# Patient Record
Sex: Male | Born: 1950 | Race: White | Hispanic: No | Marital: Married | Smoking: Never smoker
Health system: Southern US, Community
[De-identification: ages and names within clinical notes are randomized; demographics above are authoritative.]

## PROBLEM LIST (undated history)

## (undated) DIAGNOSIS — I2510 Atherosclerotic heart disease of native coronary artery without angina pectoris: Secondary | ICD-10-CM

## (undated) DIAGNOSIS — E119 Type 2 diabetes mellitus without complications: Secondary | ICD-10-CM

## (undated) DIAGNOSIS — G459 Transient cerebral ischemic attack, unspecified: Secondary | ICD-10-CM

## (undated) DIAGNOSIS — I639 Cerebral infarction, unspecified: Secondary | ICD-10-CM

## (undated) HISTORY — PX: CORONARY ARTERY BYPASS GRAFT: SHX141

---

## 2016-07-31 ENCOUNTER — Observation Stay (HOSPITAL_COMMUNITY): Payer: Medicare Other

## 2016-07-31 ENCOUNTER — Emergency Department (HOSPITAL_COMMUNITY): Payer: Medicare Other

## 2016-07-31 ENCOUNTER — Encounter (HOSPITAL_COMMUNITY): Payer: Self-pay | Admitting: Adult Health

## 2016-07-31 ENCOUNTER — Observation Stay (HOSPITAL_COMMUNITY)
Admission: EM | Admit: 2016-07-31 | Discharge: 2016-08-01 | Disposition: A | Discharge status: Home / Self Care | Payer: Medicare Other | Attending: Family Medicine | Admitting: Family Medicine

## 2016-07-31 DIAGNOSIS — Z7984 Long term (current) use of oral hypoglycemic drugs: Secondary | ICD-10-CM | POA: Diagnosis not present

## 2016-07-31 DIAGNOSIS — R4781 Slurred speech: Secondary | ICD-10-CM | POA: Diagnosis not present

## 2016-07-31 DIAGNOSIS — Z888 Allergy status to other drugs, medicaments and biological substances status: Secondary | ICD-10-CM | POA: Diagnosis not present

## 2016-07-31 DIAGNOSIS — E119 Type 2 diabetes mellitus without complications: Secondary | ICD-10-CM

## 2016-07-31 DIAGNOSIS — G459 Transient cerebral ischemic attack, unspecified: Principal | ICD-10-CM | POA: Insufficient documentation

## 2016-07-31 DIAGNOSIS — Z951 Presence of aortocoronary bypass graft: Secondary | ICD-10-CM | POA: Insufficient documentation

## 2016-07-31 DIAGNOSIS — Z8673 Personal history of transient ischemic attack (TIA), and cerebral infarction without residual deficits: Secondary | ICD-10-CM | POA: Insufficient documentation

## 2016-07-31 DIAGNOSIS — Z6828 Body mass index (BMI) 28.0-28.9, adult: Secondary | ICD-10-CM | POA: Diagnosis not present

## 2016-07-31 DIAGNOSIS — R200 Anesthesia of skin: Secondary | ICD-10-CM | POA: Diagnosis not present

## 2016-07-31 DIAGNOSIS — Z79899 Other long term (current) drug therapy: Secondary | ICD-10-CM | POA: Diagnosis not present

## 2016-07-31 DIAGNOSIS — Z7901 Long term (current) use of anticoagulants: Secondary | ICD-10-CM | POA: Diagnosis not present

## 2016-07-31 DIAGNOSIS — I10 Essential (primary) hypertension: Secondary | ICD-10-CM | POA: Diagnosis not present

## 2016-07-31 DIAGNOSIS — Z7982 Long term (current) use of aspirin: Secondary | ICD-10-CM | POA: Insufficient documentation

## 2016-07-31 DIAGNOSIS — R202 Paresthesia of skin: Secondary | ICD-10-CM | POA: Diagnosis not present

## 2016-07-31 DIAGNOSIS — I2510 Atherosclerotic heart disease of native coronary artery without angina pectoris: Secondary | ICD-10-CM | POA: Insufficient documentation

## 2016-07-31 DIAGNOSIS — E1151 Type 2 diabetes mellitus with diabetic peripheral angiopathy without gangrene: Secondary | ICD-10-CM | POA: Insufficient documentation

## 2016-07-31 DIAGNOSIS — Z880 Allergy status to penicillin: Secondary | ICD-10-CM | POA: Insufficient documentation

## 2016-07-31 DIAGNOSIS — E669 Obesity, unspecified: Secondary | ICD-10-CM | POA: Diagnosis not present

## 2016-07-31 DIAGNOSIS — Z7902 Long term (current) use of antithrombotics/antiplatelets: Secondary | ICD-10-CM | POA: Insufficient documentation

## 2016-07-31 DIAGNOSIS — I340 Nonrheumatic mitral (valve) insufficiency: Secondary | ICD-10-CM | POA: Diagnosis not present

## 2016-07-31 HISTORY — DX: Transient cerebral ischemic attack, unspecified: G45.9

## 2016-07-31 HISTORY — DX: Type 2 diabetes mellitus without complications: E11.9

## 2016-07-31 HISTORY — DX: Atherosclerotic heart disease of native coronary artery without angina pectoris: I25.10

## 2016-07-31 HISTORY — DX: Cerebral infarction, unspecified: I63.9

## 2016-07-31 LAB — DIFFERENTIAL
BASOS PCT: 0 %
Basophils Absolute: 0 10*3/uL (ref 0.0–0.1)
EOS ABS: 0.1 10*3/uL (ref 0.0–0.7)
Eosinophils Relative: 1 %
Lymphocytes Relative: 31 %
Lymphs Abs: 2.7 10*3/uL (ref 0.7–4.0)
MONO ABS: 1.2 10*3/uL — AB (ref 0.1–1.0)
Monocytes Relative: 14 %
NEUTROS ABS: 4.7 10*3/uL (ref 1.7–7.7)
Neutrophils Relative %: 54 %

## 2016-07-31 LAB — COMPREHENSIVE METABOLIC PANEL
ALT: 31 U/L (ref 17–63)
ANION GAP: 10 (ref 5–15)
AST: 23 U/L (ref 15–41)
Albumin: 4.3 g/dL (ref 3.5–5.0)
Alkaline Phosphatase: 47 U/L (ref 38–126)
BUN: 15 mg/dL (ref 6–20)
CHLORIDE: 102 mmol/L (ref 101–111)
CO2: 24 mmol/L (ref 22–32)
CREATININE: 0.97 mg/dL (ref 0.61–1.24)
Calcium: 9.5 mg/dL (ref 8.9–10.3)
Glucose, Bld: 109 mg/dL — ABNORMAL HIGH (ref 65–99)
POTASSIUM: 3.7 mmol/L (ref 3.5–5.1)
SODIUM: 136 mmol/L (ref 135–145)
Total Bilirubin: 0.9 mg/dL (ref 0.3–1.2)
Total Protein: 6.8 g/dL (ref 6.5–8.1)

## 2016-07-31 LAB — I-STAT CHEM 8, ED
BUN: 18 mg/dL (ref 6–20)
Calcium, Ion: 1.16 mmol/L (ref 1.15–1.40)
Chloride: 101 mmol/L (ref 101–111)
Creatinine, Ser: 1 mg/dL (ref 0.61–1.24)
Glucose, Bld: 106 mg/dL — ABNORMAL HIGH (ref 65–99)
HEMATOCRIT: 42 % (ref 39.0–52.0)
HEMOGLOBIN: 14.3 g/dL (ref 13.0–17.0)
POTASSIUM: 3.6 mmol/L (ref 3.5–5.1)
SODIUM: 139 mmol/L (ref 135–145)
TCO2: 26 mmol/L (ref 0–100)

## 2016-07-31 LAB — PROTIME-INR
INR: 1.05
PROTHROMBIN TIME: 13.7 s (ref 11.4–15.2)

## 2016-07-31 LAB — APTT: APTT: 30 s (ref 24–36)

## 2016-07-31 LAB — I-STAT TROPONIN, ED: TROPONIN I, POC: 0 ng/mL (ref 0.00–0.08)

## 2016-07-31 LAB — CBC
HCT: 41.1 % (ref 39.0–52.0)
Hemoglobin: 14.2 g/dL (ref 13.0–17.0)
MCH: 31.1 pg (ref 26.0–34.0)
MCHC: 34.5 g/dL (ref 30.0–36.0)
MCV: 90.1 fL (ref 78.0–100.0)
PLATELETS: 232 10*3/uL (ref 150–400)
RBC: 4.56 MIL/uL (ref 4.22–5.81)
RDW: 12.8 % (ref 11.5–15.5)
WBC: 8.7 10*3/uL (ref 4.0–10.5)

## 2016-07-31 LAB — GLUCOSE, CAPILLARY: GLUCOSE-CAPILLARY: 146 mg/dL — AB (ref 65–99)

## 2016-07-31 LAB — CBG MONITORING, ED: GLUCOSE-CAPILLARY: 147 mg/dL — AB (ref 65–99)

## 2016-07-31 MED ORDER — ACETAMINOPHEN 325 MG PO TABS: 650.0000 mg | ORAL_TABLET | ORAL | Status: DC | PRN

## 2016-07-31 MED ORDER — LABETALOL HCL 5 MG/ML IV SOLN: 10.0000 mg | INTRAVENOUS | Status: DC | PRN

## 2016-07-31 MED ORDER — ACETAMINOPHEN 650 MG RE SUPP: 650.0000 mg | RECTAL | Status: DC | PRN

## 2016-07-31 MED ORDER — LOSARTAN POTASSIUM 50 MG PO TABS
100.0000 mg | ORAL_TABLET | Freq: Every day | ORAL | Status: DC
  Administered 2016-08-01: 100 mg via ORAL
  Filled 2016-07-31: qty 2

## 2016-07-31 MED ORDER — ASPIRIN EC 325 MG PO TBEC
325.0000 mg | DELAYED_RELEASE_TABLET | Freq: Once | ORAL | Status: AC
  Administered 2016-08-01: 325 mg via ORAL
  Filled 2016-07-31: qty 1

## 2016-07-31 MED ORDER — METOPROLOL TARTRATE 25 MG PO TABS
25.0000 mg | ORAL_TABLET | Freq: Two times a day (BID) | ORAL | Status: DC
  Administered 2016-07-31 – 2016-08-01 (×2): 25 mg via ORAL
  Filled 2016-07-31 (×2): qty 1

## 2016-07-31 MED ORDER — STROKE: EARLY STAGES OF RECOVERY BOOK
Freq: Once | Status: AC
  Administered 2016-07-31: 23:00:00

## 2016-07-31 MED ORDER — ATORVASTATIN CALCIUM 40 MG PO TABS: 40.0000 mg | ORAL_TABLET | Freq: Every day | ORAL | Status: DC

## 2016-07-31 MED ORDER — ENOXAPARIN SODIUM 40 MG/0.4ML ~~LOC~~ SOLN
40.0000 mg | SUBCUTANEOUS | Status: DC
  Administered 2016-07-31: 40 mg via SUBCUTANEOUS
  Filled 2016-07-31: qty 0.4

## 2016-07-31 MED ORDER — SENNOSIDES-DOCUSATE SODIUM 8.6-50 MG PO TABS: 1.0000 | ORAL_TABLET | Freq: Every evening | ORAL | Status: DC | PRN

## 2016-07-31 MED ORDER — ATORVASTATIN CALCIUM 40 MG PO TABS
40.0000 mg | ORAL_TABLET | Freq: Every day | ORAL | Status: DC
  Administered 2016-07-31: 40 mg via ORAL
  Filled 2016-07-31: qty 1

## 2016-07-31 MED ORDER — ACETAMINOPHEN 160 MG/5ML PO SOLN: 650.0000 mg | ORAL | Status: DC | PRN

## 2016-07-31 NOTE — H&P (Signed)
History and Physical   Gregory Roman WUJ:[id number] DOB: Jun 11, 1950 DOA: 07/31/2016  Referring MD/NP/PA: Rolan Bucco, MD, EDP PCP: Silva Bandy, MD Outpatient Specialists: [facility], Neurologist in [place], [place]. Patient coming from: Home  Chief Complaint: finger numbness, slurred speech, right facial droop  HPI: Gregory Roman is a 66 y.o. male with a history of 5 previous TIAs, HTN, CAD s/p CABG, and NIDDM brought by EMS for neurological deficits. He and his wife describe sudden onset at ~1:30pm today of numbness in the fingertips of the 3rd-5th fingers on the right hand radiating to right elbow associated with "garbled speech," faint right facial droop per wife, and word blocking (couldn't recall the word "cookie") which lasted about 20 minutes and subsided after taking a 325mg  aspirin. He also endorses right (his dominant) hand weakness. Symptoms resolved en route to ED.  ED Course: Vital signs were notable for hypertension, with nonfocal neurological exam and negative head CT. Neurology evaluated the patient and initially recommended discharge, but later recommended observation for expedited work up. Hospitalists consulted.  Review of Systems: No fever, chills, headache, and per HPI. All others reviewed and are negative.   Past Medical History:  Diagnosis Date  . Coronary artery disease   . Diabetes mellitus without complication (HCC)   . Stroke (HCC)   . TIA (transient ischemic attack)    Past Surgical History:  Procedure Laterality Date  . CORONARY ARTERY BYPASS GRAFT     - Never smoker, drinks 1 - 2 glasses of red wine with dinner nightly. No other drugs.   Allergies  Allergen Reactions  . Penicillins     Child hood allergy    History reviewed. No pertinent family history. - Family history otherwise reviewed and not pertinent. No premature CAD.   Physical Exam: Vitals:   07/31/16 1557 07/31/16 1558 07/31/16 1715 07/31/16 1737  BP: (!) 181/94  (!) 180/90   Pulse: 79  77    Resp: 18  (!) 27   Temp: 99.7 F (37.6 C)   99.6 F (37.6 C)  TempSrc: Oral     SpO2: 97%  96%   Weight:  91.4 kg (201 lb 8 oz)     Constitutional: 66 y.o. male in no distress, calm demeanor Eyes: Lids and conjunctivae normal, PERRL ENMT: Mucous membranes are moist. Posterior pharynx clear of any exudate or lesions. Fair dentition.  Neck: normal, supple, no masses, no thyromegaly Respiratory: Non-labored breathing without accessory muscle use. Clear breath sounds to auscultation bilaterally Cardiovascular: Regular rate and rhythm, no murmurs, rubs, or gallops. No carotid bruits. No JVD. No LE edema. 2+ pedal pulses. Abdomen: Normoactive bowel sounds. No tenderness, non-distended, and no masses palpated. No hepatosplenomegaly. GU: No indwelling catheter Musculoskeletal: No clubbing / cyanosis. No joint deformity upper and lower extremities. Good ROM, no contractures. Normal muscle tone.  Skin: Warm, dry. No rashes, wounds, no ulcers. No significant lesions noted.  Neurologic: CN II-XII grossly intact. Gait narrow-based. Speech normal. No focal deficits in motor strength or sensation in all extremities.  Psychiatric: Alert and oriented x3. Normal judgment and insight. Mood euthymic with congruent affect.   CBC:  Recent Labs Lab 07/31/16 1500 07/31/16 1532  WBC 8.7  --   NEUTROABS 4.7  --   HGB 14.2 14.3  HCT 41.1 42.0  MCV 90.1  --   PLT 232  --    Basic Metabolic Panel:  Recent Labs Lab 07/31/16 1500 07/31/16 1532  NA 136 139  K 3.7 3.6  CL 102  101  CO2 24  --   GLUCOSE 109* 106*  BUN 15 18  CREATININE 0.97 1.00  CALCIUM 9.5  --    Liver Function Tests:  Recent Labs Lab 07/31/16 1500  AST 23  ALT 31  ALKPHOS 47  BILITOT 0.9  PROT 6.8  ALBUMIN 4.3   Radiological Exams on Admission: Ct Head Code Stroke W/o Cm  Result Date: 07/31/2016 CLINICAL DATA:  Code stroke. No history provided or note in the medical record. EXAM: CT HEAD WITHOUT CONTRAST TECHNIQUE:  Contiguous axial images were obtained from the base of the skull through the vertex without intravenous contrast. COMPARISON:  None. FINDINGS: Brain: No evidence of acute infarction, hemorrhage, hydrocephalus, extra-axial collection or mass lesion/mass effect. There is patchy bilateral cerebral white matter low density compatible with chronic small vessel ischemia. Remote appearing left thalamus lacunar infarct and left putamen/ deep white matter perforator infarct. Vascular: Atherosclerotic calcification. No asymmetric vessel hyperdensity. Skull: No acute finding Sinuses/Orbits: No acute finding Other: Text page with results sent 07/31/2016 at 3:36 pm to Dr. Sharee HolsterEhsraghi. ASPECTS [facility]([place] Stroke Program Early CT Score) Deficit was not reported.  Aspects is 10 bilaterally. IMPRESSION: 1. No acute finding. ASPECTS is 10 bilaterally. 2. Small vessel ischemic injury. Electronically Signed   By: Marnee SpringJonathon  Watts M.D.   On: 07/31/2016 15:39   EKG: Independently reviewed. NSR with normal intervals and axis. No ST segment changes. No priors available.  Assessment/Plan Active Problems:   Transient ischemic attack (TIA)  Suspected recurrent TIA: RF's include history of TIA (received tPA in the remote past), CAD (h/o CABG), DM, HTN. No tPA given due to symptoms resolved. Seen by neurology in ED initially thought to be candidate for discharge, but high risk history favors observation and expedited work up.  - Neurohospitalist to follow patient 5/12. - MRI of brain w/o contrast ordered - 2D echocardiogram  - Carotid dopplers  - Neuro checks per protocol - PT/OT/SLP: Early mobilization - Anti-platelet per neurology (took aspirin today, has rash reaction to plavix) - High intensity statin - Permissive HTN: labetalol IV prn SBP > 220 or DBP > 120 - Hb A1c, lipids  CAD: s/p hx CABG. Trop negative, no anginal complaints.  - Continue beta blocker, ARB, anti-platelet  T2DM:  - Hold metformin and [facility] - SSI - Check  HbA1c as above  HTN:  - Allowing permissive HTN as above - Continue home medications and monitor  DVT prophylaxis: Lovenox  Code Status: Full  Family Communication: Wife at bedside Disposition Plan: Observe overnight, complete TIA work up. Anticipate DC to home 5/12.  Consults called: Neurology by EDP  Admission status: Observation    Hazeline Junkeryan Noreene Boreman, MD [facility] Pager [phone]  If 7PM-7AM, please contact night-coverage [email or web address] Password [email or web address] 07/31/2016, 5:39 PM

## 2016-07-31 NOTE — ED Triage Notes (Signed)
Presents with onset of right sided facial droop, right hand tingling to elbow and difficulty getting words out. LKW was 1455. Symptoms have resolved. Neurology cancelled code stroke in CT

## 2016-07-31 NOTE — Consult Note (Cosign Needed)
Reason for Consult: Code Stroke Referring Physician: ER  Gregory Roman is an 66 y.o. male.  HPI: Acute onset of word finding difficulty and right hand numbness.  He denies facial involvement to me.  LSN 14:55.  He admits to having numbness and tingling in both hands involving digits 2-4 for past year and this was the same just stronger.  He has had an EMG/NCS as outpatient but does not know results clearly.  His BP is high and was compliant with BP meds  and he had been taking ASA 325 daily.  He is non-smoker.  His glucose was 102.  He is on statin. CT Brain was normal.   Past Medical History:  Diagnosis Date  . Coronary artery disease   . Diabetes mellitus without complication (HCC)   . Stroke (HCC)   . TIA (transient ischemic attack)     Past Surgical History:  Procedure Laterality Date  . CORONARY ARTERY BYPASS GRAFT      History reviewed. No pertinent family history.  Social History:  reports that he has never smoked. He has never used smokeless tobacco. He reports that he does not drink alcohol or use drugs.  Allergies: Allergies not on file  Prior to Admission medications   Not on File    Medications: Prior to Admission:  (Not in a hospital admission) Scheduled:  Results for orders placed or performed during the hospital encounter of 07/31/16 (from the past 48 hour(s))  Protime-INR     Status: None   Collection Time: 07/31/16  3:00 PM  Result Value Ref Range   Prothrombin Time 13.7 11.4 - 15.2 seconds   INR 1.05   APTT     Status: None   Collection Time: 07/31/16  3:00 PM  Result Value Ref Range   aPTT 30 24 - 36 seconds  CBC     Status: None   Collection Time: 07/31/16  3:00 PM  Result Value Ref Range   WBC 8.7 4.0 - 10.5 K/uL   RBC 4.56 4.22 - 5.81 MIL/uL   Hemoglobin 14.2 13.0 - 17.0 g/dL   HCT 82.9 56.2 - 13.0 %   MCV 90.1 78.0 - 100.0 fL   MCH 31.1 26.0 - 34.0 pg   MCHC 34.5 30.0 - 36.0 g/dL   RDW 86.5 78.4 - 69.6 %   Platelets 232 150 - 400 K/uL   Differential     Status: Abnormal   Collection Time: 07/31/16  3:00 PM  Result Value Ref Range   Neutrophils Relative % 54 %   Neutro Abs 4.7 1.7 - 7.7 K/uL   Lymphocytes Relative 31 %   Lymphs Abs 2.7 0.7 - 4.0 K/uL   Monocytes Relative 14 %   Monocytes Absolute 1.2 (H) 0.1 - 1.0 K/uL   Eosinophils Relative 1 %   Eosinophils Absolute 0.1 0.0 - 0.7 K/uL   Basophils Relative 0 %   Basophils Absolute 0.0 0.0 - 0.1 K/uL  I-stat troponin, ED     Status: None   Collection Time: 07/31/16  3:30 PM  Result Value Ref Range   Troponin i, poc 0.00 0.00 - 0.08 ng/mL   Comment 3            Comment: Due to the release kinetics of cTnI, a negative result within the first hours of the onset of symptoms does not rule out myocardial infarction with certainty. If myocardial infarction is still suspected, repeat the test at appropriate intervals.   I-Stat Chem 8, ED  Status: Abnormal   Collection Time: 07/31/16  3:32 PM  Result Value Ref Range   Sodium 139 135 - 145 mmol/L   Potassium 3.6 3.5 - 5.1 mmol/L   Chloride 101 101 - 111 mmol/L   BUN 18 6 - 20 mg/dL   Creatinine, Ser 1.611.00 0.61 - 1.24 mg/dL   Glucose, Bld 096106 (H) 65 - 99 mg/dL   Calcium, Ion 0.451.16 4.091.15 - 1.40 mmol/L   TCO2 26 0 - 100 mmol/L   Hemoglobin 14.3 13.0 - 17.0 g/dL   HCT 81.142.0 91.439.0 - 78.252.0 %    Ct Head Code Stroke W/o Cm  Result Date: 07/31/2016 CLINICAL DATA:  Code stroke. No history provided or note in the medical record. EXAM: CT HEAD WITHOUT CONTRAST TECHNIQUE: Contiguous axial images were obtained from the base of the skull through the vertex without intravenous contrast. COMPARISON:  None. FINDINGS: Brain: No evidence of acute infarction, hemorrhage, hydrocephalus, extra-axial collection or mass lesion/mass effect. There is patchy bilateral cerebral white matter low density compatible with chronic small vessel ischemia. Remote appearing left thalamus lacunar infarct and left putamen/ deep white matter perforator  infarct. Vascular: Atherosclerotic calcification. No asymmetric vessel hyperdensity. Skull: No acute finding Sinuses/Orbits: No acute finding Other: Text page with results sent 07/31/2016 at 3:36 pm to Dr. Sharee HolsterEhsraghi. ASPECTS [facility]([place] Stroke Program Early CT Score) Deficit was not reported.  Aspects is 10 bilaterally. IMPRESSION: 1. No acute finding. ASPECTS is 10 bilaterally. 2. Small vessel ischemic injury. Electronically Signed   By: Marnee SpringJonathon  Watts M.D.   On: 07/31/2016 15:39    ROS Blood pressure (!) 181/94, pulse 79, temperature 99.7 F (37.6 C), temperature source Oral, resp. rate 18, weight 91.4 kg (201 lb 8 oz), SpO2 97 %. Neurologic Examination:  Awake, alert, fully oriented.  Language-fluent.  Comprehension, naming, repetition- intact. Face symmetrical. Tongue midline. EOMI.  PERL. Strength 5/5 BUE and BLE. Coord - FTN, HTS intact bilaterally.  Assessment/Plan:  Possible TIA due to uncontrolled BP.  It may also be peripheral nerve related and naming issues was not related.    He was not given IV tPA due to resolution of all symptoms.    Recommended he see PCP asap for better BP control.  He was asked to switch from ASA to Plavix 75 mg qd.  His PCP can check FLP and adjust statin as needed.  His DM seems to be well controlled.    From my view point, he can be discharged to f/u with PCP asap.  He can have CDUS and TTE as outpatient.      Weston SettleESHRAGHI, Mahsa Hanser, MD 07/31/2016, 4:04 PM

## 2016-07-31 NOTE — ED Notes (Signed)
Patient taken to MRI

## 2016-07-31 NOTE — ED Notes (Signed)
[facility] contacted to cancel Code Stroke 

## 2016-07-31 NOTE — ED Provider Notes (Signed)
[facility]-EMERGENCY DEPT Provider Note   CSN: [id number] Arrival date & time: 07/31/16  1525     History   Chief Complaint Chief Complaint  Patient presents with  . Code Stroke  . Seizures    HPI Gregory Roman is a 66 y.o. male.  Patient is a 66 year old male with a history of diabetes, coronary artery disease, TIA/CVA who presents with dys artery and right arm numbness/weakness. His last known normal was 1:30 PM today. Per the patient and his wife he had a sudden onset of numbness to his right hand and arm and he dropped something that he was holding. He also is having trouble getting his words out and had some right-sided facial drooping. There is no leg involvement. No balance issues. Code stroke was activated by EMS. This was subsequently canceled on neurology evaluation. Patient states that his symptoms resolved in route to the hospital and he currently denies any strokelike symptoms. He denies any chest pain or shortness of breath. No other recent illnesses. He's had 4-5 prior TIAs with similar symptoms. He states he also got TPA in the past for a stroke which he thinks was about a year ago. He is followed by a neurologist in [facility].      Past Medical History:  Diagnosis Date  . Coronary artery disease   . Diabetes mellitus without complication (HCC)   . Stroke (HCC)   . TIA (transient ischemic attack)     Patient Active Problem List   Diagnosis Date Noted  . Transient ischemic attack (TIA) 07/31/2016    Past Surgical History:  Procedure Laterality Date  . CORONARY ARTERY BYPASS GRAFT         Home Medications    Prior to Admission medications   Not on File    Family History History reviewed. No pertinent family history.  Social History Social History  Substance Use Topics  . Smoking status: Never Smoker  . Smokeless tobacco: Never Used  . Alcohol use No     Allergies   Patient has no allergy information on record.   Review of Systems Review of  Systems  Constitutional: Negative for chills, diaphoresis, fatigue and fever.  HENT: Negative for congestion, rhinorrhea and sneezing.   Eyes: Negative.   Respiratory: Negative for cough, chest tightness and shortness of breath.   Cardiovascular: Negative for chest pain and leg swelling.  Gastrointestinal: Negative for abdominal pain, blood in stool, diarrhea, nausea and vomiting.  Genitourinary: Negative for difficulty urinating, flank pain, frequency and hematuria.  Musculoskeletal: Negative for arthralgias and back pain.  Skin: Negative for rash.  Neurological: Positive for speech difficulty, weakness and numbness. Negative for dizziness and headaches.     Physical Exam Updated Vital Signs BP (!) 181/94 (BP Location: Right Arm)   Pulse 79   Temp 99.7 F (37.6 C) (Oral)   Resp 18   Wt 201 lb 8 oz (91.4 kg)   SpO2 97%   Physical Exam  Constitutional: He is oriented to person, place, and time. He appears well-developed and well-nourished.  HENT:  Head: Normocephalic and atraumatic.  Eyes: Pupils are equal, round, and reactive to light.  Neck: Normal range of motion. Neck supple.  Cardiovascular: Normal rate, regular rhythm and normal heart sounds.   Pulmonary/Chest: Effort normal and breath sounds normal. No respiratory distress. He has no wheezes. He has no rales. He exhibits no tenderness.  Abdominal: Soft. Bowel sounds are normal. There is no tenderness. There is no rebound and no guarding.  Musculoskeletal: Normal range of motion. He exhibits no edema.  Lymphadenopathy:    He has no cervical adenopathy.  Neurological: He is alert and oriented to person, place, and time.  Motor 5/5 all extremities Sensation grossly intact to LT all extremities Finger to Nose intact, no pronator drift CN II-XII grossly intact    Skin: Skin is warm and dry. No rash noted.  Psychiatric: He has a normal mood and affect.     ED Treatments / Results  Labs (all labs ordered are listed,  but only abnormal results are displayed) Labs Reviewed  DIFFERENTIAL - Abnormal; Notable for the following:       Result Value   Monocytes Absolute 1.2 (*)    All other components within normal limits  COMPREHENSIVE METABOLIC PANEL - Abnormal; Notable for the following:    Glucose, Bld 109 (*)    All other components within normal limits  I-STAT CHEM 8, ED - Abnormal; Notable for the following:    Glucose, Bld 106 (*)    All other components within normal limits  PROTIME-INR  APTT  CBC  I-STAT TROPOININ, ED  CBG MONITORING, ED    EKG  EKG Interpretation  Date/Time:  Friday Jul 31 2016 16:04:05 EDT Ventricular Rate:  75 PR Interval:    QRS Duration: 101 QT Interval:  403 QTC Calculation: 451 R Axis:   64 Text Interpretation:  Sinus rhythm RSR' in V1 or V2, probably normal variant No old tracing to compare Confirmed by Demetry Bendickson  MD, Doloros Kwolek ([id number]) on 07/31/2016 4:21:32 PM       Radiology Ct Head Code Stroke W/o Cm  Result Date: 07/31/2016 CLINICAL DATA:  Code stroke. No history provided or note in the medical record. EXAM: CT HEAD WITHOUT CONTRAST TECHNIQUE: Contiguous axial images were obtained from the base of the skull through the vertex without intravenous contrast. COMPARISON:  None. FINDINGS: Brain: No evidence of acute infarction, hemorrhage, hydrocephalus, extra-axial collection or mass lesion/mass effect. There is patchy bilateral cerebral white matter low density compatible with chronic small vessel ischemia. Remote appearing left thalamus lacunar infarct and left putamen/ deep white matter perforator infarct. Vascular: Atherosclerotic calcification. No asymmetric vessel hyperdensity. Skull: No acute finding Sinuses/Orbits: No acute finding Other: Text page with results sent 07/31/2016 at 3:36 pm to Dr. Sharee Holster. ASPECTS [facility] Stroke Program Early CT Score) Deficit was not reported.  Aspects is 10 bilaterally. IMPRESSION: 1. No acute finding. ASPECTS is 10 bilaterally. 2.  Small vessel ischemic injury. Electronically Signed   By: Marnee Spring M.D.   On: 07/31/2016 15:39    Procedures Procedures (including critical care time)  Medications Ordered in ED Medications - No data to display   Initial Impression / Assessment and Plan / ED Course  I have reviewed the triage vital signs and the nursing notes.  Pertinent labs & imaging results that were available during my care of the patient were reviewed by me and considered in my medical decision making (see chart for details).     Patient presents with right-sided facial drooping associated with word finding difficulty and right-sided numbness and weakness. Patient CT is negative. He has no current symptoms. His symptoms are possibly consistent with a TIA. He's had a history of TIAs in the past. His past admissions were in [place]. I spoke with the neuro hospitalist who will follow the patient and we will admit the patient to the hospitalist service. I spoke with Dr.Grunz who has agreed to admit the patient.  Final Clinical  Impressions(s) / ED Diagnoses   Final diagnoses:  Transient cerebral ischemia, unspecified type    New Prescriptions New Prescriptions   No medications on file     Rolan BuccoBelfi, Cintia Gleed, MD 07/31/16 1704

## 2016-08-01 ENCOUNTER — Observation Stay (HOSPITAL_COMMUNITY): Payer: Medicare Other

## 2016-08-01 ENCOUNTER — Other Ambulatory Visit (HOSPITAL_COMMUNITY): Payer: Medicare Other

## 2016-08-01 ENCOUNTER — Observation Stay (HOSPITAL_BASED_OUTPATIENT_CLINIC_OR_DEPARTMENT_OTHER): Payer: Medicare Other

## 2016-08-01 DIAGNOSIS — G458 Other transient cerebral ischemic attacks and related syndromes: Secondary | ICD-10-CM | POA: Diagnosis not present

## 2016-08-01 DIAGNOSIS — E119 Type 2 diabetes mellitus without complications: Secondary | ICD-10-CM | POA: Diagnosis not present

## 2016-08-01 DIAGNOSIS — G459 Transient cerebral ischemic attack, unspecified: Secondary | ICD-10-CM

## 2016-08-01 DIAGNOSIS — I10 Essential (primary) hypertension: Secondary | ICD-10-CM | POA: Diagnosis not present

## 2016-08-01 LAB — LIPID PANEL
CHOLESTEROL: 88 mg/dL (ref 0–200)
HDL: 33 mg/dL — AB (ref >40)
LDL CALC: 32 mg/dL (ref 0–99)
TRIGLYCERIDES: 114 mg/dL (ref <150)
Total CHOL/HDL Ratio: 2.7 RATIO
VLDL: 23 mg/dL (ref 0–40)

## 2016-08-01 LAB — ECHOCARDIOGRAM COMPLETE
CHL CUP DOP CALC LVOT VTI: 20 cm
E decel time: 236 msec
EERAT: 8.78
FS: 31 % (ref 28–44)
HEIGHTINCHES: 69 in
IV/PV OW: 1.08
LA vol A4C: 65 ml
LA vol index: 23.9 mL/m2
LA vol: 49.4 mL
LADIAMINDEX: 1.74 cm/m2
LASIZE: 36 mm
LEFT ATRIUM END SYS DIAM: 36 mm
LV dias vol index: 39 mL/m2
LVDIAVOL: 80 mL (ref 62–150)
LVEEAVG: 8.78
LVEEMED: 8.78
LVELAT: 9.57 cm/s
LVOT area: 3.14 cm2
LVOT diameter: 20 mm
LVOT peak grad rest: 3 mmHg
LVOTPV: 93.5 cm/s
LVOTSV: 63 mL
LVSYSVOL: 29 mL
LVSYSVOLIN: 14 mL/m2
MV Dec: 236
MV pk A vel: 84.3 m/s
MVAP: 3.19 cm2
MVPG: 3 mmHg
MVPKEVEL: 84 m/s
P 1/2 time: 69 ms
PW: 13 mm — AB (ref 0.6–1.1)
RV LATERAL S' VELOCITY: 9.57 cm/s
RV TAPSE: 15.2 mm
Simpson's disk: 63
Stroke v: 51 ml
TDI e' lateral: 9.57
TDI e' medial: 5.66
Weight: 3051.2 oz

## 2016-08-01 LAB — GLUCOSE, CAPILLARY: Glucose-Capillary: 119 mg/dL — ABNORMAL HIGH (ref 65–99)

## 2016-08-01 NOTE — Progress Notes (Signed)
Pt d/c to home by car with family. Assessment stable. All questions answered. 

## 2016-08-01 NOTE — Care Management Note (Signed)
Case Management Note  Patient Details  Name: Aleatha BorerJames Regner MRN: [id number] Date of Birth: [birth year]  Subjective/Objective:                 Patient in observation. No CM consults, orders, or needs identified at this time. Order to DC to home.    Action/Plan:  Dc to home.  Expected Discharge Date:  08/01/16               Expected Discharge Plan:  Home/Self Care  In-House Referral:     Discharge planning Services  CM Consult  Post Acute Care Choice:    Choice offered to:     DME Arranged:    DME Agency:     HH Arranged:    HH Agency:     Status of Service:  Completed, signed off  If discussed at [employer] Length of Stay Meetings, dates discussed:    Additional Comments:  Lawerance SabalDebbie Adena Sima, RN 08/01/2016, 4:07 PM

## 2016-08-01 NOTE — Discharge Summary (Signed)
Physician Discharge Summary  Gregory Roman ZOX:[id number] DOB: 1951-02-05 DOA: 07/31/2016  PCP: Gregory Bandy, MD  Admit date: 07/31/2016 Discharge date: 08/01/2016  Admitted From: Home Disposition: Home   Recommendations for Outpatient Follow-up:  1. Follow up with PCP in next week for HTN management. 2. HbA1c 6.7%.   Home Health: None Equipment/Devices: None Discharge Condition: Stable CODE STATUS: Full Diet recommendation: Heart healthy, carbohydrate-limited  Brief/Interim Summary: Gregory Roman is a 66 y.o. male with a history of 5 previous TIAs, HTN, CAD s/p CABG, and NIDDM brought by EMS for neurological deficits. He and his wife describe sudden onset at ~1:30pm today of numbness in the fingertips of the 3rd-5th fingers on the right hand radiating to right elbow associated with "garbled speech," faint right facial droop per wife, and word blocking (couldn't recall the word "cookie") which lasted about 20 minutes and subsided after taking a 325mg  aspirin. He also endorses right (his dominant) hand weakness. Symptoms resolved en route to ED.  Vital signs were notable for hypertension, with nonfocal neurological exam and negative head CT. Neurology evaluated the patient and initially recommended discharge, but later recommended observation for expedited work up. Hospitalists consulted. MRI was negative. Echocardiogram showed no cardioembolic source, and carotid U/S showed widely patent arteries. He is continued on aspirin per neurology recommendations as he has not tolerated plavix or aggrenox in the past.   Discharge Diagnoses:  Principal Problem:   Transient ischemic attack (TIA) Active Problems:   Coronary artery disease   Diabetes mellitus without complication (HCC)   Benign essential HTN  Recurrent TIA: CT head and MRI brain negative for acute stroke, with bilateral white matter and basal ganglia lacunar infarcts and findings of chronic microvascular ischemia. RF's include  history of TIA (received tPA in the remote past), CAD (h/o CABG), DM, HTN. No tPA given due to symptoms resolved.  - Neurology recommendations as below: - 2D echocardiogram: No cardioembolic source - Carotid dopplers without high-grade stenosis - Therapy evaluations: no follow up required - Anti-platelet per neurology: Continue aspirin 325mg  daily (allergy to plavix and intolerance of aggrenox in the past) - Continue lipitor 40mg , LDL at goal.  - Permissive HTN during hospitalization, but will need PCP follow up for stricter control.   CAD: s/p hx CABG. Trop negative, no anginal complaints.  - Continue beta blocker, ARB, anti-platelet  T2DM: HbA1c 6.7%  HTN:  - Allowing permissive HTN as above - Continue home medications and monitor  Discharge Instructions Discharge Instructions    Discharge instructions    Complete by:  As directed    You were admitted for stroke-like symptoms which have resolved. The work up to date has remained negative and neurology has evaluated you. You are stable for discharge with the following recommendations:  - Continue taking all medications as prescribed including aspirin 325mg  daily.  - Follow up with your doctor within the next week for hospital follow up appointment and blood pressure evaluation. You may require increased intensity of hypertension management.     Allergies as of 08/01/2016      Reactions   Actos [pioglitazone] Swelling   Flomax [tamsulosin]    Flush    Lisinopril Swelling   Penicillins    Child hood allergy    Ciprofloxacin Rash   Plavix [clopidogrel] Rash      Medication List    TAKE these medications   aspirin 325 MG tablet Take 325 mg by mouth daily.   atorvastatin 40 MG tablet Commonly known as:  LIPITOR Take  40 mg by mouth daily.   glimepiride 2 MG tablet Commonly known as:  AMARYL Take 2 mg by mouth daily with breakfast.   losartan 100 MG tablet Commonly known as:  COZAAR Take 100 mg by mouth daily.    metFORMIN 500 MG tablet Commonly known as:  GLUCOPHAGE Take 1,000 mg by mouth 2 (two) times daily with a meal.   metoprolol tartrate 25 MG tablet Commonly known as:  LOPRESSOR Take 25 mg by mouth 2 (two) times daily.   multivitamin with minerals tablet Take 1 tablet by mouth daily.      Follow-up Information    Gregory BandyKopynec, Bohdan, MD Follow up.   Specialty:  Family Medicine Contact information: [address] [place] [id number] [phone]          Allergies  Allergen Reactions  . Actos [Pioglitazone] Swelling  . Flomax [Tamsulosin]     Flush   . Lisinopril Swelling  . Penicillins     Child hood allergy   . Ciprofloxacin Rash  . Plavix [Clopidogrel] Rash    Consultations:  Neurology  Procedures/Studies: Mr Brain Wo Contrast  Result Date: 07/31/2016 CLINICAL DATA:  Right-sided numbness and weakness. EXAM: MRI HEAD WITHOUT CONTRAST TECHNIQUE: Multiplanar, multiecho pulse sequences of the brain and surrounding structures were obtained without intravenous contrast. COMPARISON:  Head CT same day FINDINGS: Brain: The midline structures are normal. No focal diffusion restriction to indicate acute infarct. No intraparenchymal hemorrhage. Bold bilateral lacunar infarcts of the basal ganglia and corona radiata. There is multifocal hyperintense T2-weighted signal within the periventricular white matter, most often seen in the setting of chronic microvascular ischemia. No mass lesion. No chronic microhemorrhage or cerebral amyloid angiopathy. No hydrocephalus, age advanced atrophy or lobar predominant volume loss. No dural abnormality or extra-axial collection. Vascular: Major intracranial arterial and venous sinus flow voids are preserved. Skull and upper cervical spine: The visualized skull base, calvarium, upper cervical spine and extracranial soft tissues are normal. Sinuses/Orbits: No fluid levels or advanced mucosal thickening. No mastoid effusion. Normal orbits.  IMPRESSION: 1. No acute infarct. 2. Old bilateral white matter and basal ganglia lacunar infarcts and findings of chronic microvascular ischemia. Electronically Signed   By: Deatra RobinsonKevin  Herman M.D.   On: 07/31/2016 19:40   Ct Head Code Stroke W/o Cm  Result Date: 07/31/2016 CLINICAL DATA:  Code stroke. No history provided or note in the medical record. EXAM: CT HEAD WITHOUT CONTRAST TECHNIQUE: Contiguous axial images were obtained from the base of the skull through the vertex without intravenous contrast. COMPARISON:  None. FINDINGS: Brain: No evidence of acute infarction, hemorrhage, hydrocephalus, extra-axial collection or mass lesion/mass effect. There is patchy bilateral cerebral white matter low density compatible with chronic small vessel ischemia. Remote appearing left thalamus lacunar infarct and left putamen/ deep white matter perforator infarct. Vascular: Atherosclerotic calcification. No asymmetric vessel hyperdensity. Skull: No acute finding Sinuses/Orbits: No acute finding Other: Text page with results sent 07/31/2016 at 3:36 pm to Dr. Sharee HolsterEhsraghi. ASPECTS [facility]([place] Stroke Program Early CT Score) Deficit was not reported.  Aspects is 10 bilaterally. IMPRESSION: 1. No acute finding. ASPECTS is 10 bilaterally. 2. Small vessel ischemic injury. Electronically Signed   By: Marnee SpringJonathon  Watts M.D.   On: 07/31/2016 15:39    Echocardiogram 08/01/2016: - Left ventricle: The cavity size was normal. Wall thickness was   increased in a pattern of moderate LVH. Systolic function was   normal. The estimated ejection fraction was in the range of 55%   to 60%. Wall  motion was normal; there were no regional wall   motion abnormalities. - Aortic root: The aortic root was mildly dilated. - Mitral valve: There was mild regurgitation. - Left atrium: The atrium was mildly dilated.  - No cardiac source of emboli was indentified.   Carotid ultrasound 08/01/2016: Preliminary read 1 - 39% stenosis  bilaterally  Subjective: No complaints. No further deficits.   Discharge Exam: BP (!) 164/83 (BP Location: Left Arm)   Pulse 65   Temp 98.6 F (37 C) (Oral)   Resp 20   Ht 5\' 9"  (1.753 m)   Wt 86.5 kg (190 lb 11.2 oz)   SpO2 96%   BMI 28.16 kg/m   General: Pt is alert, awake, not in acute distress Cardiovascular: RRR, S1/S2 +, no rubs, no gallops Respiratory: CTA bilaterally, no wheezing, no rhonchi Abdominal: Soft, NT, ND, bowel sounds + Extremities: No edema, no cyanosis Neuro: Alert, oriented, no focal deficits, gait and speech normal.  Labs: Basic Metabolic Panel:  Recent Labs Lab 07/31/16 1500 07/31/16 1532  NA 136 139  K 3.7 3.6  CL 102 101  CO2 24  --   GLUCOSE 109* 106*  BUN 15 18  CREATININE 0.97 1.00  CALCIUM 9.5  --    Liver Function Tests:  Recent Labs Lab 07/31/16 1500  AST 23  ALT 31  ALKPHOS 47  BILITOT 0.9  PROT 6.8  ALBUMIN 4.3   CBC:  Recent Labs Lab 07/31/16 1500 07/31/16 1532  WBC 8.7  --   NEUTROABS 4.7  --   HGB 14.2 14.3  HCT 41.1 42.0  MCV 90.1  --   PLT 232  --    Lipid Profile  Recent Labs  08/01/16 0311  CHOL 88  HDL 33*  LDLCALC 32  TRIG 161  CHOLHDL 2.7   Time coordinating discharge: Approximately 40 minutes  Hazeline Junker, MD  [facility] 08/01/2016, 1:30 PM Pager [phone]  If 7PM-7AM, please contact night-coverage [email or web address] Password [email or web address]

## 2016-08-01 NOTE — Progress Notes (Signed)
VASCULAR LAB PRELIMINARY  PRELIMINARY  PRELIMINARY  PRELIMINARY  Carotid duplex completed.    Preliminary report:  1-39% ICA plaquing. Vertebral artery flow is antegrade.   Verbena Boeding, RVT 08/01/2016, 11:03 AM

## 2016-08-01 NOTE — Evaluation (Addendum)
Physical Therapy Evaluation and Discharge Summary  Patient Details Name: Gregory Roman MRN: [id number] DOB: Mar 11, 1951 Today's Date: 08/01/2016   History of Present Illness  Pt is a 66 y/o male admitted secondary to finger numbness, slurred speech and R facial droop. MRI revealed no acute infarct with pt's symptoms now resolved (suspected recurrent TIA). PMH including but not limited to CAD s/p CABG, DM and hx of TIA's x5.  Clinical Impression  Pt presented supine in bed with HOB elevated, awake and willing to participate in therapy session. Prior to admission, pt reported that he was independent with all functional mobility and ADLs. Pt is retired, works out (cardio and weight lifting) 3x/week and continues to drive. He lives with his wife who is also retired. Pt ambulated in hallway with supervision without an AD and participated in a higher level balance test (DGI = 24/24). No further acute PT needs identified at this time. PT signing off.    Follow Up Recommendations No PT follow up    Equipment Recommendations  None recommended by PT    Recommendations for Other Services       Precautions / Restrictions Precautions Precautions: None Restrictions Weight Bearing Restrictions: No      Mobility  Bed Mobility Overal bed mobility: Independent                Transfers Overall transfer level: Modified independent Equipment used: None                Ambulation/Gait Ambulation/Gait assistance: Supervision Ambulation Distance (Feet): 300 Feet Assistive device: None Gait Pattern/deviations: Step-through pattern;WFL(Within Functional Limits) Gait velocity: WFL Gait velocity interpretation: at or above normal speed for age/gender General Gait Details: no instability or LOB, supervision for safety  Stairs Stairs: Yes Stairs assistance: Supervision Stair Management: No rails;Alternating pattern;Forwards Number of Stairs: 4    Wheelchair Mobility    Modified  Rankin (Stroke Patients Only) Modified Rankin (Stroke Patients Only) Pre-Morbid Rankin Score: No symptoms Modified Rankin: No symptoms     Balance Overall balance assessment: Needs assistance Sitting-balance support: Feet supported Sitting balance-Leahy Scale: Normal     Standing balance support: During functional activity;No upper extremity supported Standing balance-Leahy Scale: Good                   Standardized Balance Assessment Standardized Balance Assessment : Dynamic Gait Index   Dynamic Gait Index Level Surface: Normal Change in Gait Speed: Normal Gait with Horizontal Head Turns: Normal Gait with Vertical Head Turns: Normal Gait and Pivot Turn: Mild Impairment Step Over Obstacle: Normal Step Around Obstacles: Normal Steps: Normal Total Score: 23       Pertinent Vitals/Pain Pain Assessment: No/denies pain    Home Living Family/patient expects to be discharged to:: Private residence Living Arrangements: Spouse/significant other Available Help at Discharge: Family;Available 24 hours/day Type of Home: House Home Access: Stairs to enter Entrance Stairs-Rails: Lawyer of Steps: 3 Home Layout: One level Home Equipment: Walker - 2 wheels;Cane - single point;Crutches;Bedside commode      Prior Function Level of Independence: Independent               Hand Dominance   Dominant Hand: Right    Extremity/Trunk Assessment   Upper Extremity Assessment Upper Extremity Assessment: Overall WFL for tasks assessed    Lower Extremity Assessment Lower Extremity Assessment: Overall WFL for tasks assessed    Cervical / Trunk Assessment Cervical / Trunk Assessment: Normal  Communication   Communication: No difficulties  Cognition Arousal/Alertness: Awake/alert Behavior During Therapy: WFL for tasks assessed/performed Overall Cognitive Status: Within Functional Limits for tasks assessed                                         General Comments      Exercises     Assessment/Plan    PT Assessment Patent does not need any further PT services  PT Problem List         PT Treatment Interventions      PT Goals (Current goals can be found in the Care Plan section)  Acute Rehab PT Goals Patient Stated Goal: return home    Frequency     Barriers to discharge        Co-evaluation               AM-PAC PT "6 Clicks" Daily Activity  Outcome Measure Difficulty turning over in bed (including adjusting bedclothes, sheets and blankets)?: None Difficulty moving from lying on back to sitting on the side of the bed? : None Difficulty sitting down on and standing up from a chair with arms (e.g., wheelchair, bedside commode, etc,.)?: None Help needed moving to and from a bed to chair (including a wheelchair)?: None Help needed walking in hospital room?: None Help needed climbing 3-5 steps with a railing? : None 6 Click Score: 24    End of Session   Activity Tolerance: Patient tolerated treatment well Patient left: in bed;with call bell/phone within reach Nurse Communication: Mobility status PT Visit Diagnosis: Other abnormalities of gait and mobility (R26.89);Other symptoms and signs involving the nervous system (R29.898)    Time: [id number] PT Time Calculation (min) (ACUTE ONLY): 26 min   Charges:   PT Evaluation $PT Eval Low Complexity: 1 Procedure PT Treatments $Gait Training: 8-22 mins   PT G Codes:   PT G-Codes **NOT FOR INPATIENT CLASS** Functional Assessment Tool Used: AM-PAC 6 Clicks Basic Mobility;Clinical judgement Functional Limitation: Mobility: Walking and moving around Mobility: Walking and Moving Around Current Status ([id number](G8978): 0 percent impaired, limited or restricted Mobility: Walking and Moving Around Goal Status (J1914(G8979): 0 percent impaired, limited or restricted Mobility: Walking and Moving Around Discharge Status [phone](G8980): 0 percent impaired, limited or  restricted    [facility] Memorial HospitalJennifer Phu Record, PT, DPT [phone]   [name] 08/01/2016, 9:00 AM

## 2016-08-01 NOTE — Progress Notes (Signed)
OT Cancellation Note  Patient Details Name: Gregory Roman MRN: [id number] DOB: [birth year]   Cancelled Treatment:    Reason Eval/Treat Not Completed: Patient at procedure or test/ unavailable. Will follow up as time allows.  Gaye AlkenBailey A Hardeep Reetz M.S., OTR/L Pager: [phone]  08/01/2016, 11:42 AM

## 2016-08-01 NOTE — Progress Notes (Signed)
STROKE TEAM PROGRESS NOTE   HISTORY OF PRESENT ILLNESS (per record)  Gregory Roman is a 66 y.o.male who experienced acute onset of word finding difficulty and right hand numbness.  He denies facial involvement to me.  LSN 14:55.  He admits to having numbness and tingling in both hands involving digits 2-4 for past year and this was the same just stronger.  He has had an EMG/NCS as outpatient but does not know results clearly.  His BP is high and was compliant with BP meds  and he had been taking ASA 325 daily.  He is non-smoker.  His glucose was 102.  He is on statin. CT Brain was normal.   SUBJECTIVE (INTERVAL HISTORY) The patient's wife is present. The patient feels his speech has returned to baseline. This is his fifth TIA. He reports that he has not yet had a stroke. He reports that he is  allergic to Plavix.   OBJECTIVE Temp:  [98.4 F (36.9 C)-99.7 F (37.6 C)] 98.4 F (36.9 C) (05/12 0610) Pulse Rate:  [61-79] 72 (05/12 0754) Cardiac Rhythm: Normal sinus rhythm (05/11 2040) Resp:  [16-27] 16 (05/12 0754) BP: (141-184)/(73-95) 184/95 (05/12 0754) SpO2:  [93 %-98 %] 98 % (05/12 0754) Weight:  [86.5 kg (190 lb 11.2 oz)-91.4 kg (201 lb 8 oz)] 86.5 kg (190 lb 11.2 oz) (05/11 2010)  CBC:  Recent Labs Lab 07/31/16 1500 07/31/16 1532  WBC 8.7  --   NEUTROABS 4.7  --   HGB 14.2 14.3  HCT 41.1 42.0  MCV 90.1  --   PLT 232  --     Basic Metabolic Panel:  Recent Labs Lab 07/31/16 1500 07/31/16 1532  NA 136 139  K 3.7 3.6  CL 102 101  CO2 24  --   GLUCOSE 109* 106*  BUN 15 18  CREATININE 0.97 1.00  CALCIUM 9.5  --     Lipid Panel:    Component Value Date/Time   CHOL 88 08/01/2016 0311   TRIG 114 08/01/2016 0311   HDL 33 (L) 08/01/2016 0311   CHOLHDL 2.7 08/01/2016 0311   VLDL 23 08/01/2016 0311   LDLCALC 32 08/01/2016 0311   HgbA1c: No results found for: HGBA1C Urine Drug Screen: No results found for: LABOPIA, COCAINSCRNUR, LABBENZ, AMPHETMU, THCU, LABBARB   Alcohol Level No results found for: [facility]  IMAGING  Mr Brain Wo Contrast 07/31/2016 1. No acute infarct.  2. Old bilateral white matter and basal ganglia lacunar infarcts and findings of chronic microvascular ischemia.  Ct Head Code Stroke W/o Cm 07/31/2016 1. No acute finding. ASPECTS is 10 bilaterally.  2. Small vessel ischemic injury.   PHYSICAL EXAM  Pleasant middle-age male currently not in distress. . Afebrile. Head is nontraumatic. Neck is supple without bruit.    Cardiac exam no murmur or gallop. Lungs are clear to auscultation. Distal pulses are well felt.  Neurological Exam ;  Awake  Alert oriented x 3. Normal speech and language.eye movements full without nystagmus.fundi were not visualized. Vision acuity and fields appear normal. Hearing is normal. Palatal movements are normal. Face symmetric. Tongue midline. Normal strength, tone, reflexes and coordination. Normal sensation. Gait deferred.      ASSESSMENT/PLAN Gregory Roman is a 66 y.o. male with history of previous TIAs, previous strokes by imaging, diabetes mellitus, and coronary artery disease presenting with right hand numbness and word finding difficulties. He did not receive IV t-PA due to resolving deficits.  TIA:  Small vessel disease.  Resultant  No new deficits  MRI  - no acute infarct. Old bilateral white matter and basal ganglia lacunar infarcts.  MRA - not performed  Carotid Doppler - 1-39% ICA plaquing. Vertebral artery flow is antegrade.   2D Echo - pending  LDL - 32  HgbA1c - pending  VTE prophylaxis - Lovenox  Diet Carb Modified Fluid consistency: Thin; Room service appropriate? Yes  aspirin 325 mg daily prior to admission, now on aspirin 325 mg daily  Patient counseled to be compliant with his antithrombotic medications  Ongoing aggressive stroke risk factor management  Therapy recommendations:  No follow-up physical therapy recommended  Disposition: Possible discharge later  today  Hypertension  Blood pressure high at times  Permissive hypertension (OK if < 220/120) but gradually normalize in 5-7 days  Long-term BP goal normotensive  Hyperlipidemia  Home meds: Lipitor 40 mg daily resumed in hospital  LDL 32, goal < 70  Continue statin at discharge  Diabetes  HgbA1c pending, goal < 7.0  Unc / Controlled  Other Stroke Risk Factors  Advanced age  Overweight, Body mass index is 28.16 kg/m., recommend weight loss, diet and exercise as appropriate   Hx stroke/TIA  Coronary artery disease   Other Active Problems  Plavix allergy    Hospital day # 0  Delton See PA-C [facility] Pager [phone] 08/01/2016, 12:43 PM I have personally examined this patient, reviewed notes, independently viewed imaging studies, participated in medical decision making and plan of care.ROS completed by me personally and pertinent positives fully documented  I have made any additions or clarifications directly to the above note. Agree with note above. The patient presented with transient speech difficulties and right upper extremity weakness likely due to a left brain TIA. He has history of Plavix allergy and has not tolerated Aggrenox in the past hence continue aspirin. Greater than 50% time during this 35 minute visit was spent on counseling and coordination of care about his TIA and stroke risk, prevention and treatment discussion and answering questions. Check Doppler and echocardiogram. Follow-up as an outpatient in stroke clinic.  Delia Heady, MD Medical Director [facility] Pager: [phone] 08/01/2016 1:10 PM   To contact Stroke Continuity provider, please refer to [email or web address]. After hours, contact General Neurology

## 2016-08-01 NOTE — Progress Notes (Signed)
SLP Cancellation Note  Patient Details Name: Gregory Roman MRN: [id number] DOB: 1950/04/20   Cancelled treatment:       Reason Eval/Treat Not Completed: Patient at procedure or test/unavailable. SLP will f/u as schedule allows.  Rondel BatonMary Beth [name], [place]MS, CCC-SLP Speech-Language Pathologist [phone]   Arlana LindauMary E Savier Trickett 08/01/2016, 2:49 PM

## 2016-08-01 NOTE — Progress Notes (Signed)
*  PRELIMINARY RESULTS* Echocardiogram 2D Echocardiogram has been performed.  Stacey DrainWhite, Cashus Halterman J 08/01/2016, 11:39 AM

## 2016-08-01 NOTE — Discharge Instructions (Signed)
Transient Ischemic Attack °A transient ischemic attack (TIA) is a "warning stroke" that causes stroke-like symptoms. Unlike a stroke, a TIA does not cause permanent damage to the brain. The symptoms of a TIA can happen very fast and do not last long. It is important to know the symptoms of a TIA and what to do. This can help prevent a major stroke or death. °What are the causes? °A TIA is caused by a temporary blockage in an artery in the brain or neck (carotid artery). The blockage does not allow the brain to get the blood supply it needs and can cause different symptoms. The blockage can be caused by either: °· A blood clot. °· Fatty buildup (plaque) in a neck or brain artery. ° °What increases the risk? °· High blood pressure (hypertension). °· High cholesterol. °· Diabetes mellitus. °· Heart disease. °· The buildup of plaque in the blood vessels (peripheral artery disease or atherosclerosis). °· The buildup of plaque in the blood vessels that provide blood and oxygen to the brain (carotid artery stenosis). °· An abnormal heart rhythm (atrial fibrillation). °· Obesity. °· Using any tobacco products, including cigarettes, chewing tobacco, or electronic cigarettes. °· Taking oral contraceptives, especially in combination with using tobacco. °· Physical inactivity. °· A diet high in fats, salt (sodium), and calories. °· Excessive alcohol use. °· Use of illegal drugs (especially cocaine and methamphetamine). °· Being male. °· Being African American. °· Being over the age of 55 years. °· Family history of stroke. °· Previous history of blood clots, stroke, TIA, or heart attack. °· Sickle cell disease. °What are the signs or symptoms? °TIA symptoms are the same as a stroke but are temporary. These symptoms usually develop suddenly, or may be newly present upon waking from sleep: °· Sudden weakness or numbness of the face, arm, or leg, especially on one side of the body. °· Sudden trouble walking or difficulty moving  arms or legs. °· Sudden confusion. °· Sudden personality changes. °· Trouble speaking (aphasia) or understanding. °· Difficulty swallowing. °· Sudden trouble seeing in one or both eyes. °· Double vision. °· Dizziness. °· Loss of balance or coordination. °· Sudden severe headache with no known cause. °· Trouble reading or writing. °· Loss of bowel or bladder control. °· Loss of consciousness. ° °How is this diagnosed? °Your health care provider may be able to determine the presence or absence of a TIA based on your symptoms, history, and physical exam. CT scan of the brain is usually performed to help identify a TIA. Other tests may include: °· Electrocardiography (ECG). °· Continuous heart monitoring. °· Echocardiography. °· Carotid ultrasonography. °· MRI. °· A scan of the brain circulation. °· Blood tests. ° °How is this treated? °Since the symptoms of TIA are the same as a stroke, it is important to seek treatment as soon as possible. You may need a medicine to dissolve a blood clot (thrombolytic) if that is the cause of the TIA. This medicine cannot be given if too much time has passed. Treatment may also include: °· Rest, oxygen, fluids through an IV tube, and medicines to thin the blood (anticoagulants). °· Measures will be taken to prevent short-term and long-term complications, including infection from breathing foreign material into the lungs (aspiration pneumonia), blood clots in the legs, and falls. °· Procedures to either remove plaque in the carotid arteries or dilate carotid arteries that have narrowed due to plaque. Those procedures are: °? Carotid endarterectomy. °? Carotid angioplasty and stenting. °· Medicines   and diet may be used to address diabetes, high blood pressure, and other underlying risk factors. ° °Follow these instructions at home: °· Take medicines only as directed by your health care provider. Follow the directions carefully. Medicines may be used to control risk factors for a stroke.  Be sure you understand all your medicine instructions. °· You may be told to take aspirin or the anticoagulant warfarin. Warfarin needs to be taken exactly as instructed. °? Taking too much or too little warfarin is dangerous. Too much warfarin increases the risk of bleeding. Too little warfarin continues to allow the risk for blood clots. While taking warfarin, you will need to have regular blood tests to measure your blood clotting time. A PT blood test measures how long it takes for blood to clot. Your PT is used to calculate another value called an INR. Your PT and INR help your health care provider to adjust your dose of warfarin. The dose can change for many reasons. It is critically important that you take warfarin exactly as prescribed. °? Many foods, especially foods high in vitamin K can interfere with warfarin and affect the PT and INR. Foods high in vitamin K include spinach, kale, broccoli, cabbage, collard and turnip greens, Brussels sprouts, peas, cauliflower, seaweed, and parsley, as well as beef and pork liver, green tea, and soybean oil. You should eat a consistent amount of foods high in vitamin K. Avoid major changes in your diet, or notify your health care provider before changing your diet. Arrange a visit with a dietitian to answer your questions. °? Many medicines can interfere with warfarin and affect the PT and INR. You must tell your health care provider about any and all medicines you take; this includes all vitamins and supplements. Be especially cautious with aspirin and anti-inflammatory medicines. Do not take or discontinue any prescribed or over-the-counter medicine except on the advice of your health care provider or pharmacist. °? Warfarin can have side effects, such as excessive bruising or bleeding. You will need to hold pressure over cuts for longer than usual. Your health care provider or pharmacist will discuss other potential side effects. °? Avoid sports or activities that  may cause injury or bleeding. °? Be careful when shaving, flossing your teeth, or handling sharp objects. °? Alcohol can change the body's ability to handle warfarin. It is best to avoid alcoholic drinks or consume only very small amounts while taking warfarin. Notify your health care provider if you change your alcohol intake. °? Notify your dentist or other health care providers before procedures. °· Eat a diet that includes 5 or more servings of fruits and vegetables each day. This may reduce the risk of stroke. Certain diets may be prescribed to address high blood pressure, high cholesterol, diabetes, or obesity. °? A diet low in sodium, saturated fat, trans fat, and cholesterol is recommended to manage high blood pressure. °? A diet low in saturated fat, trans fat, and cholesterol, and high in fiber may control cholesterol levels. °? A controlled-carbohydrate, controlled-sugar diet is recommended to manage diabetes. °? A reduced-calorie diet that is low in sodium, saturated fat, trans fat, and cholesterol is recommended to manage obesity. °· Maintain a healthy weight. °· Stay physically active. It is recommended that you get at least 30 minutes of activity on most or all days. °· Do not use any tobacco products, including cigarettes, chewing tobacco, or electronic cigarettes. If you need help quitting, ask your health care provider. °· Limit alcohol intake   to no more than 1 drink per day for nonpregnant women and 2 drinks per day for men. One drink equals 12 ounces of beer, 5 ounces of wine, or 1½ ounces of hard liquor. °· Do not abuse drugs. °· A safe home environment is important to reduce the risk of falls. Your health care provider may arrange for specialists to evaluate your home. Having grab bars in the bedroom and bathroom is often important. Your health care provider may arrange for equipment to be used at home, such as raised toilets and a seat for the shower. °· Follow all instructions for follow-up  with your health care provider. This is very important. This includes any referrals and lab tests. Proper follow-up can prevent a stroke or another TIA from occurring. °How is this prevented? °The risk of a TIA can be decreased by appropriately treating high blood pressure, high cholesterol, diabetes, heart disease, and obesity, and by quitting smoking, limiting alcohol, and staying physically active. °Contact a health care provider if: °· You have personality changes. °· You have difficulty swallowing. °· You are seeing double. °· You have dizziness. °· You have a fever. °Get help right away if: °Any of the following symptoms may represent a serious problem that is an emergency. Do not wait to see if the symptoms will go away. Get medical help right away. Call your local emergency services (911 in [place] Do not drive yourself to the hospital. °· You have sudden weakness or numbness of the face, arm, or leg, especially on one side of the body. °· You have sudden trouble walking or difficulty moving arms or legs. °· You have sudden confusion. °· You have trouble speaking (aphasia) or understanding. °· You have sudden trouble seeing in one or both eyes. °· You have a loss of balance or coordination. °· You have a sudden, severe headache with no known cause. °· You have new chest pain or an irregular heartbeat. °· You have a partial or total loss of consciousness. ° °This information is not intended to replace advice given to you by your health care provider. Make sure you discuss any questions you have with your health care provider. °Document Released: 12/17/2004 Document Revised: 11/11/2015 Document Reviewed: 06/14/2013 °Elsevier Interactive Patient Education © 2017 [employer] ° °

## 2016-08-01 NOTE — Evaluation (Signed)
Occupational Therapy Evaluation and Discharge  Patient Details Name: Gregory Roman MRN: [id number] DOB: 1950/06/23 Today's Date: 08/01/2016    History of Present Illness Pt is a 66 y/o male admitted secondary to finger numbness, slurred speech and R facial droop. MRI revealed no acute infarct with pt's symptoms now resolved (suspected recurrent TIA). PMH including but not limited to CAD s/p CABG, DM and hx of TIA's x5.   Clinical Impression   Pt reports he was independent with ADL PTA. Currently pt overall mod I for ADL and functional mobility. Educated pt and wife on BE FAST. Pt planning to d/c home with 24/7 supervision from family. No further acute OT needs identified; signing off at this time. Please re-consult if needs change. Thank you for this referral.    Follow Up Recommendations  No OT follow up;Supervision - Intermittent    Equipment Recommendations  None recommended by OT    Recommendations for Other Services       Precautions / Restrictions Precautions Precautions: None Restrictions Weight Bearing Restrictions: No      Mobility Bed Mobility               General bed mobility comments: Pt OOB in chair upon arrival.  Transfers Overall transfer level: Modified independent                    Balance Overall balance assessment: No apparent balance deficits (not formally assessed)                                         ADL either performed or assessed with clinical judgement   ADL Overall ADL's : Modified independent                                       General ADL Comments: Educated pt and wife on BE FAST; they verbalized understanding. Pt able to perform higher level balance activities without LOB or unsteadiness.     Vision Patient Visual Report: No change from baseline Vision Assessment?: No apparent visual deficits     Perception     Praxis      Pertinent Vitals/Pain Pain Assessment: No/denies pain      Hand Dominance Right   Extremity/Trunk Assessment Upper Extremity Assessment Upper Extremity Assessment: Overall WFL for tasks assessed   Lower Extremity Assessment Lower Extremity Assessment: Defer to PT evaluation   Cervical / Trunk Assessment Cervical / Trunk Assessment: Normal   Communication Communication Communication: No difficulties   Cognition Arousal/Alertness: Awake/alert Behavior During Therapy: WFL for tasks assessed/performed Overall Cognitive Status: Within Functional Limits for tasks assessed                                     General Comments       Exercises     Shoulder Instructions      Home Living Family/patient expects to be discharged to:: Private residence Living Arrangements: Spouse/significant other Available Help at Discharge: Family;Available 24 hours/day Type of Home: House Home Access: Stairs to enter [employer] of Steps: 3 Entrance Stairs-Rails: Left;Right Home Layout: One level     Bathroom Shower/Tub: Producer, television/film/video: Handicapped height     Home Equipment: Environmental consultant -  2 wheels;Cane - single point;Crutches;Bedside commode          Prior Functioning/Environment Level of Independence: Independent                 OT Problem List:        OT Treatment/Interventions:      OT Goals(Current goals can be found in the care plan section) Acute Rehab OT Goals Patient Stated Goal: return home OT Goal Formulation: All assessment and education complete, DC therapy  OT Frequency:     Barriers to D/C:            Co-evaluation              AM-PAC PT "6 Clicks" Daily Activity     Outcome Measure Help from another person eating meals?: None Help from another person taking care of personal grooming?: None Help from another person toileting, which includes using toliet, bedpan, or urinal?: None Help from another person bathing (including washing, rinsing, drying)?:  None Help from another person to put on and taking off regular upper body clothing?: None Help from another person to put on and taking off regular lower body clothing?: None 6 Click Score: 24   End of Session    Activity Tolerance: Patient tolerated treatment well Patient left: in chair;with family/visitor present  OT Visit Diagnosis: Muscle weakness (generalized) (M62.81)                Time: [id number] OT Time Calculation (min): 19 min Charges:  OT General Charges $OT Visit: 1 Procedure OT Evaluation $OT Eval Low Complexity: 1 Procedure G-Codes: OT G-codes **NOT FOR INPATIENT CLASS** Functional Assessment Tool Used: AM-PAC 6 Clicks Daily Activity Functional Limitation: Self care Self Care Current Status (V4098(G8987): 0 percent impaired, limited or restricted Self Care Goal Status (J1914(G8988): 0 percent impaired, limited or restricted Self Care Discharge Status (N8295(G8989): 0 percent impaired, limited or restricted   Fredric MareBailey A. Brett Albinooffey, M.S., OTR/L Pager: [phone]  Gaye AlkenBailey A Johnna Bollier 08/01/2016, 1:23 PM

## 2016-08-02 LAB — HEMOGLOBIN A1C
Hgb A1c MFr Bld: 6.7 % — ABNORMAL HIGH (ref 4.8–5.6)
MEAN PLASMA GLUCOSE: 146 mg/dL

## 2016-08-03 LAB — VAS US CAROTID
LCCAPDIAS: 17 cm/s
LEFT ECA DIAS: -10 cm/s
LEFT VERTEBRAL DIAS: -7 cm/s
Left CCA dist dias: -17 cm/s
Left CCA dist sys: -115 cm/s
Left CCA prox sys: 122 cm/s
Left ICA dist dias: -16 cm/s
Left ICA dist sys: -61 cm/s
Left ICA prox dias: -20 cm/s
Left ICA prox sys: -82 cm/s
RCCAPDIAS: 12 cm/s
RIGHT ECA DIAS: -11 cm/s
RIGHT VERTEBRAL DIAS: 8 cm/s
Right CCA prox sys: 76 cm/s
Right cca dist sys: -70 cm/s

## 2017-10-25 IMAGING — CT CT HEAD CODE STROKE
3 series · 14 of 47 positions shown, 16 images · non-contrast
Comparison: None.

CLINICAL DATA: Code stroke. No history provided or note in the
medical record.

EXAM:
CT HEAD WITHOUT CONTRAST
TECHNIQUE: Contiguous axial images were obtained from the base of the skull
through the vertex without intravenous contrast.

[Series 3: head 5.0 st · axial · 0.44mm/px · z∈[-81,+64]mm · 8 of 35 slices shown, 10 images]
[im 3/35  brain]
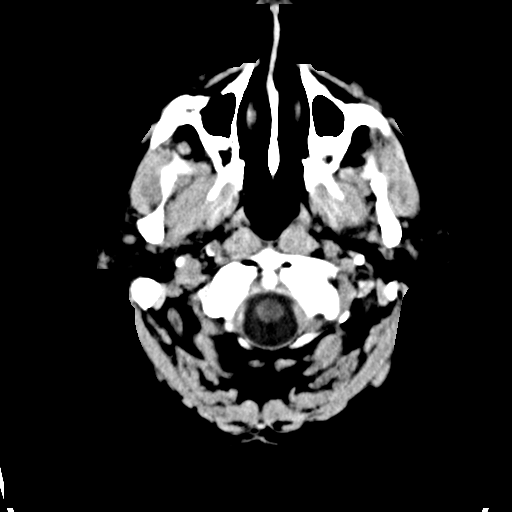
[im 3/35  bone]
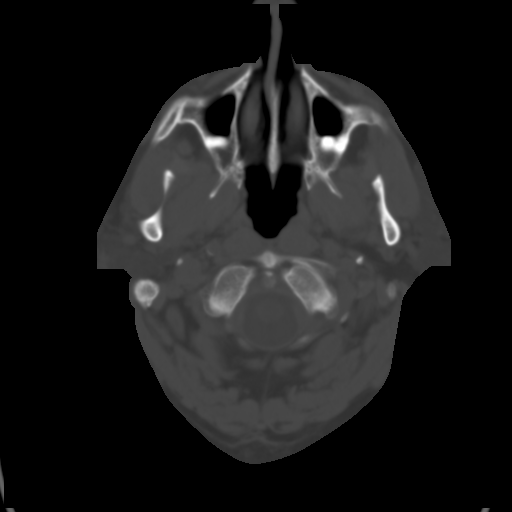
[im 8/35  brain]
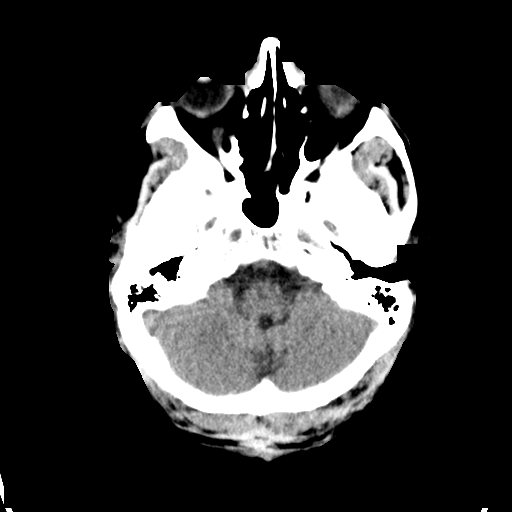
[im 11/35  brain]
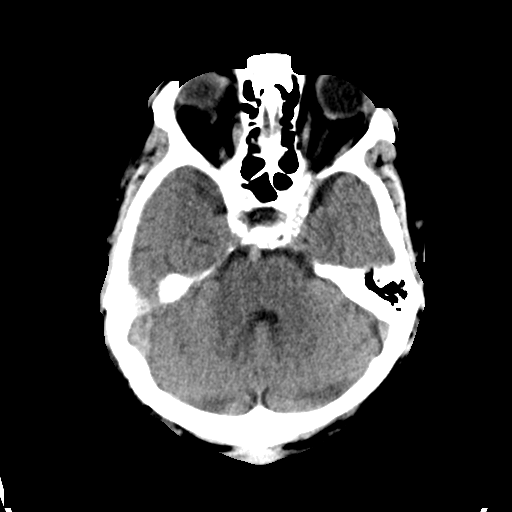
[im 16/35  brain]
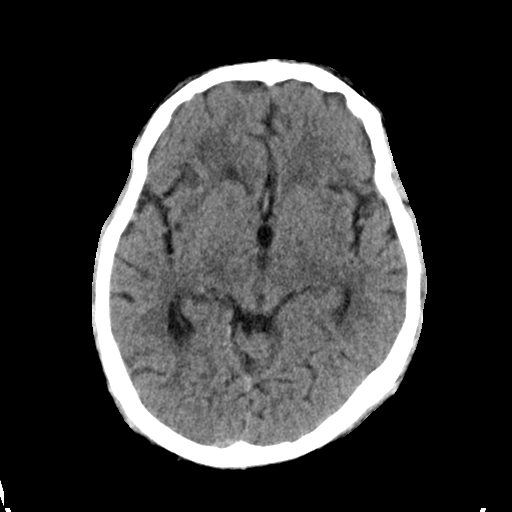
[im 19/35  brain]
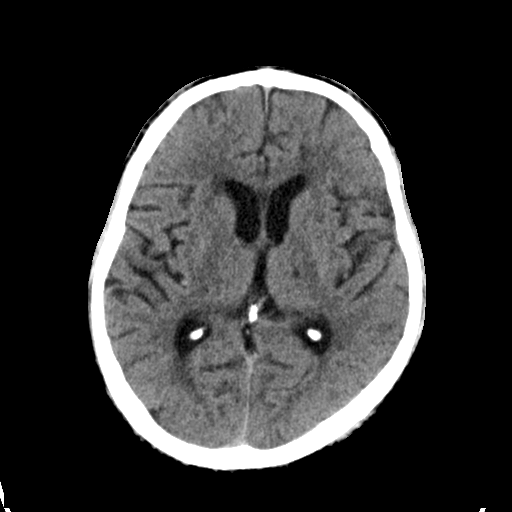
[im 19/35  bone]
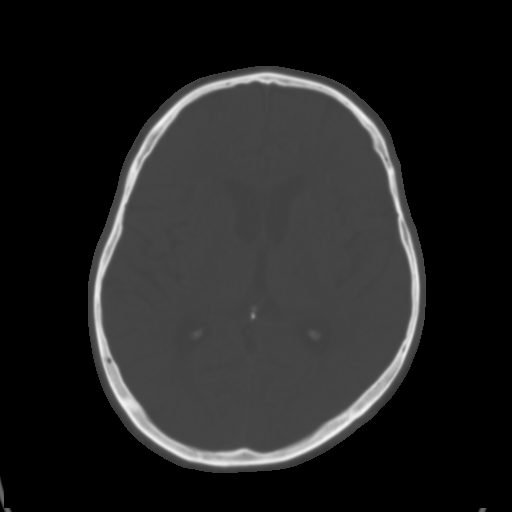
[im 24/35  brain]
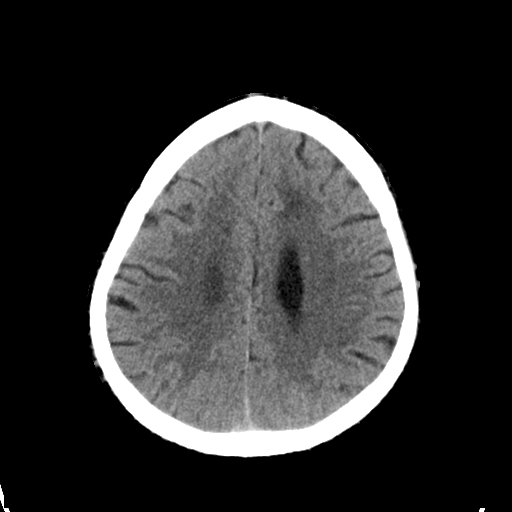
[im 27/35  brain]
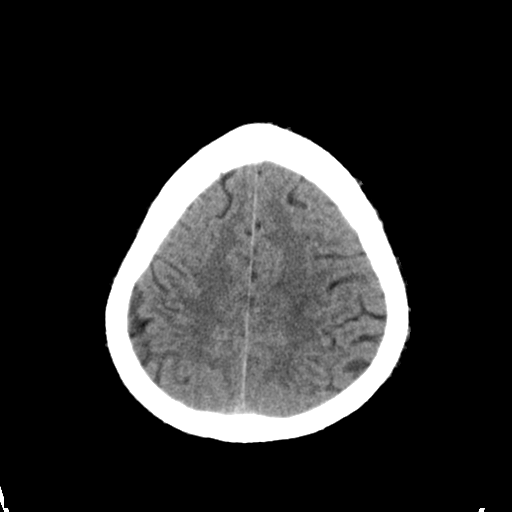
[im 32/35  brain]
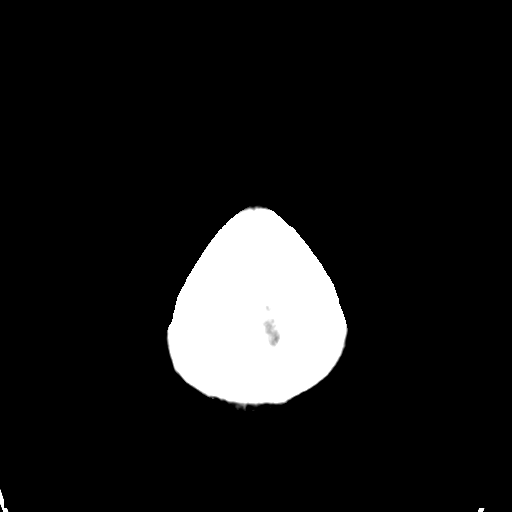

[Series 5: head 3.0 cor st · coronal · 0.36mm/px · 3 of 70 slices shown]
[im 24/70  brain]
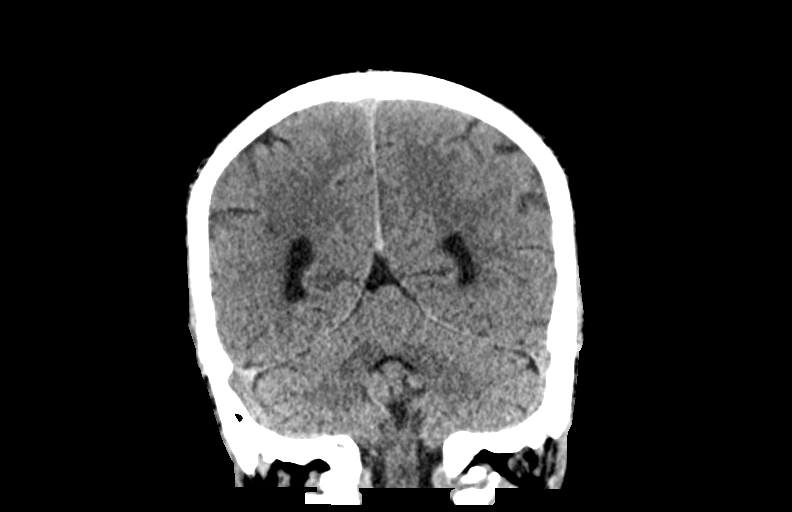
[im 31/70  brain]
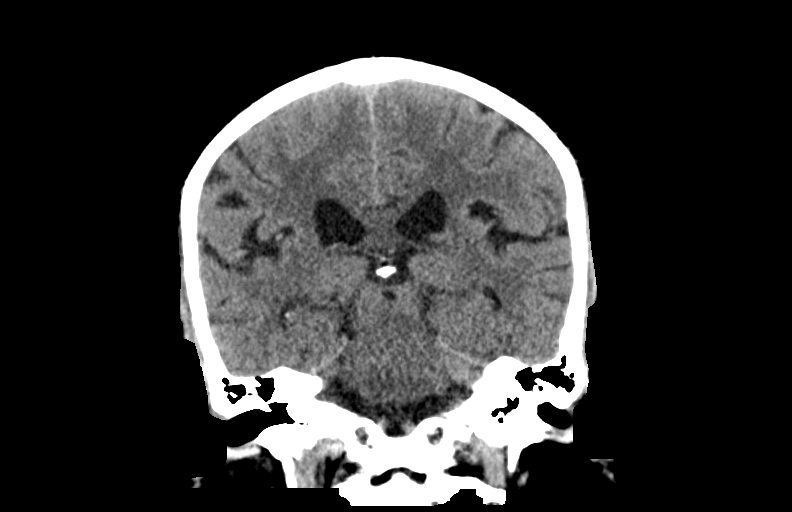
[im 39/70  brain]
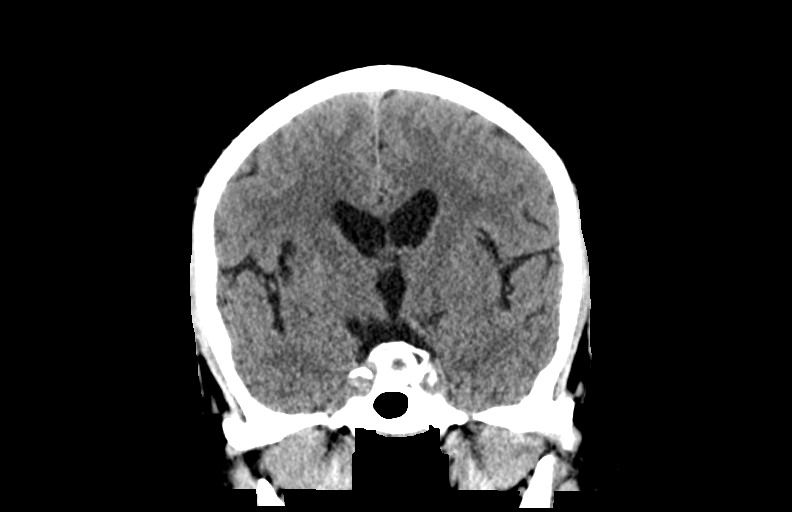

[Series 6: head 3.0 sag st · sagittal · 0.36mm/px · 3 of 67 slices shown]
[im 23/67  brain]
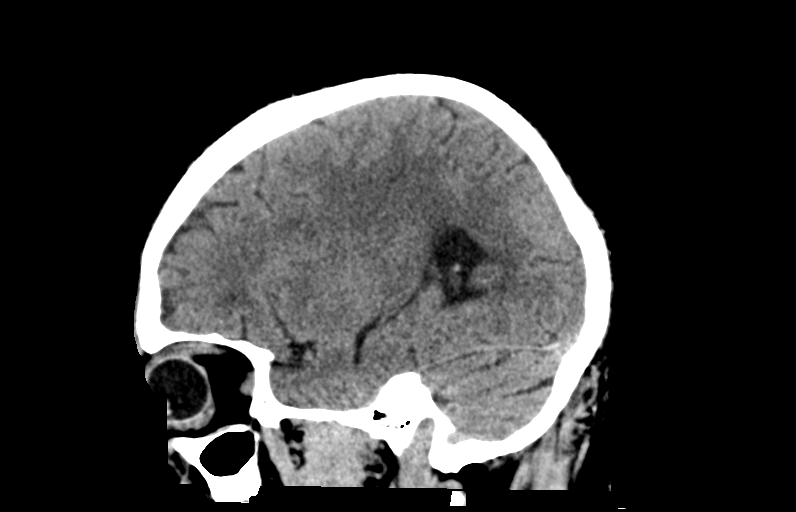
[im 34/67  brain]
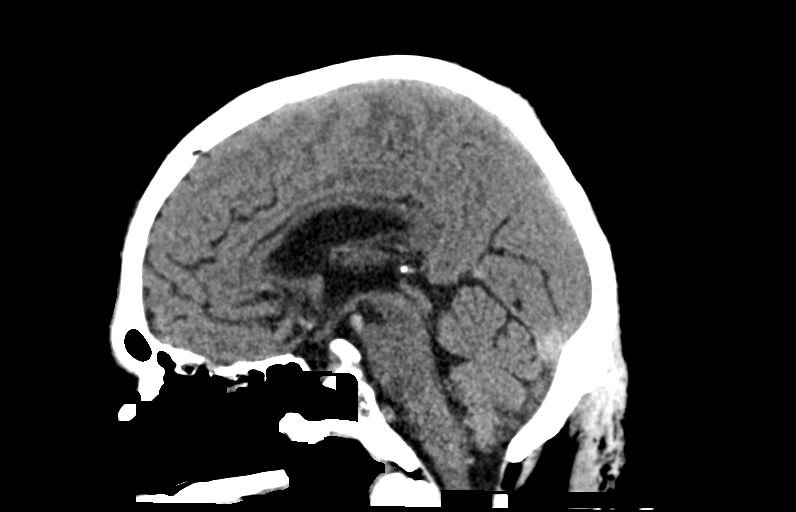
[im 45/67  brain]
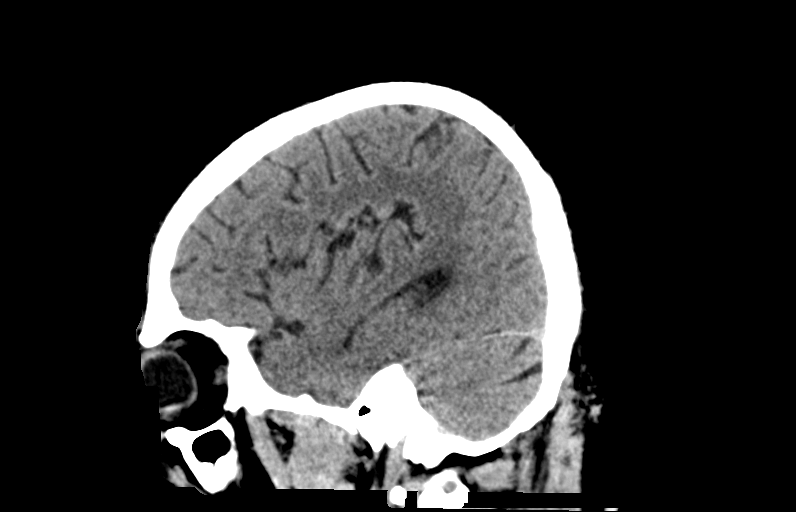

[14 of 47 positions shown; findings below may reference images not displayed]

FINDINGS: Brain: No evidence of acute infarction, hemorrhage, hydrocephalus,
extra-axial collection or mass lesion/mass effect.

There is patchy bilateral cerebral white matter low density
compatible with chronic small vessel ischemia. Remote appearing left
thalamus lacunar infarct and left putamen/ deep white matter
perforator infarct.

Vascular: Atherosclerotic calcification. No asymmetric vessel
hyperdensity.

Skull: No acute finding

Sinuses/Orbits: No acute finding

Other: Text page with results sent 07/31/2016 at [DATE] to Dr.
Felkl.

ASPECTS ([place] Stroke Program Early CT Score)

Deficit was not reported.  Aspects is 10 bilaterally.
IMPRESSION: 1. No acute finding. ASPECTS is 10 bilaterally.
2. Small vessel ischemic injury.
